# Patient Record
Sex: Male | Born: 1970 | Race: White | Hispanic: No | Marital: Married | Smoking: Never smoker
Health system: Southern US, Community
[De-identification: ages and names within clinical notes are randomized; demographics above are authoritative.]

---

## 2009-12-18 ENCOUNTER — Inpatient Hospital Stay: Payer: Self-pay | Admitting: Surgery

## 2009-12-22 LAB — PATHOLOGY REPORT

## 2009-12-31 ENCOUNTER — Ambulatory Visit: Payer: Self-pay | Admitting: Cardiovascular Disease

## 2010-04-03 HISTORY — PX: CHOLECYSTECTOMY: SHX55

## 2011-03-25 IMAGING — US ABDOMEN ULTRASOUND
1 series · 17 of 25 positions shown · non-contrast
Comparison: none

REASON FOR EXAM: abd pain check gb and aorta
COMMENTS:

PROCEDURE:     US  - US ABDOMEN GENERAL SURVEY  - December 18, 2009  [DATE]
RESULT:     Comparison: None
TECHNIQUE: Multiple gray-scale and color-flow Doppler images of the abdomen
are presented for review.

[Series 1: abdomen ultrasound · 17 of 82 slices shown]
[im 1/82]
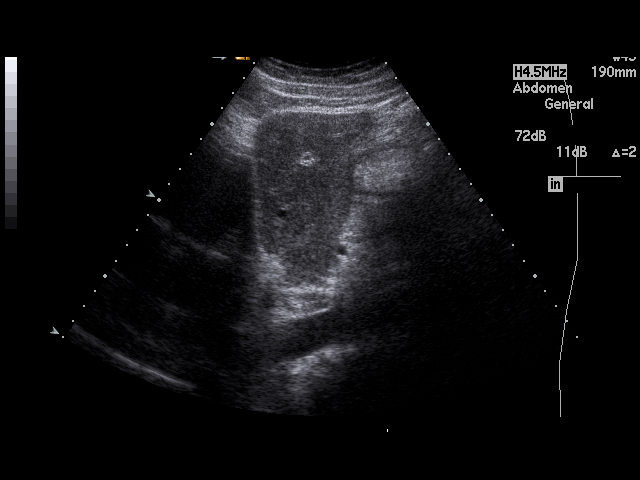
[im 7/82]
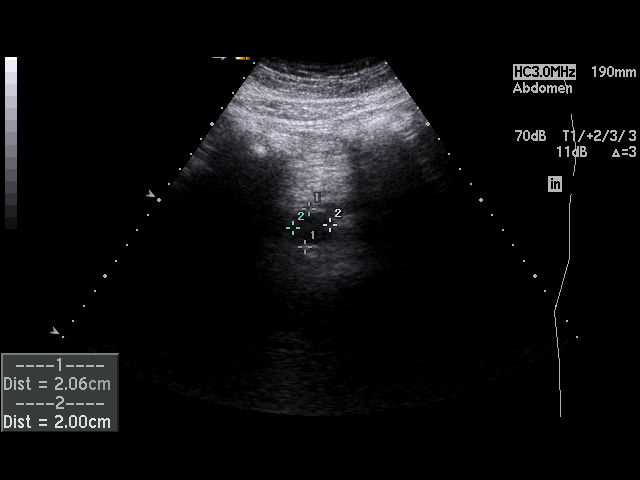
[im 11/82]
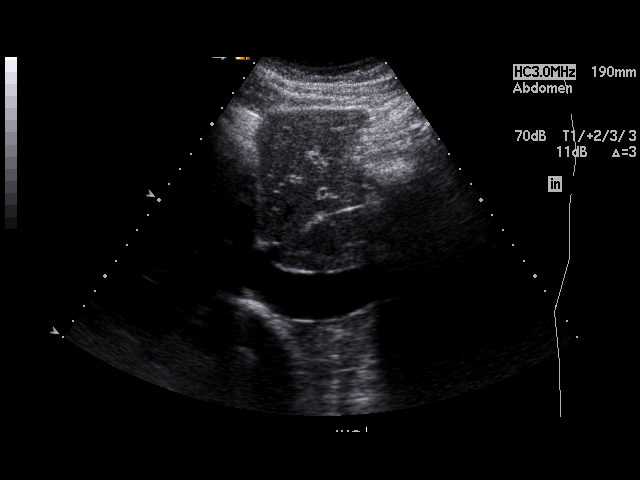
[im 17/82]
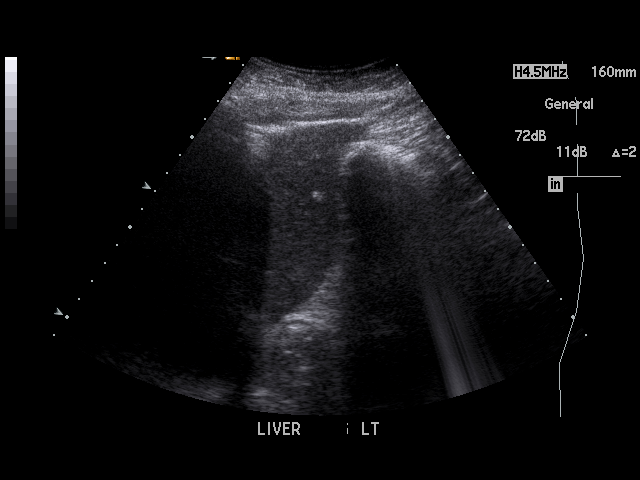
[im 21/82]
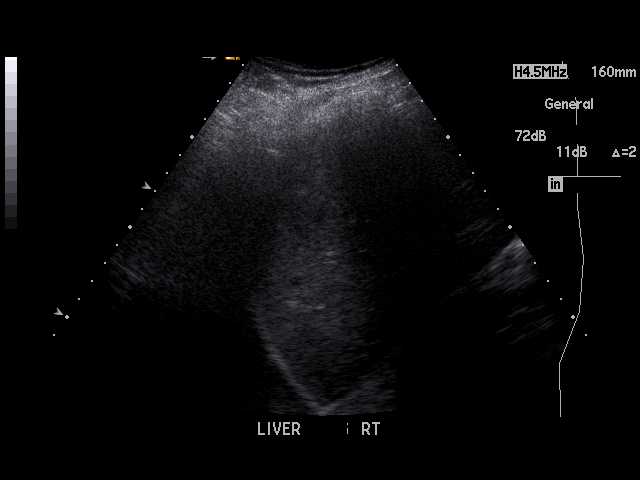
[im 28/82]
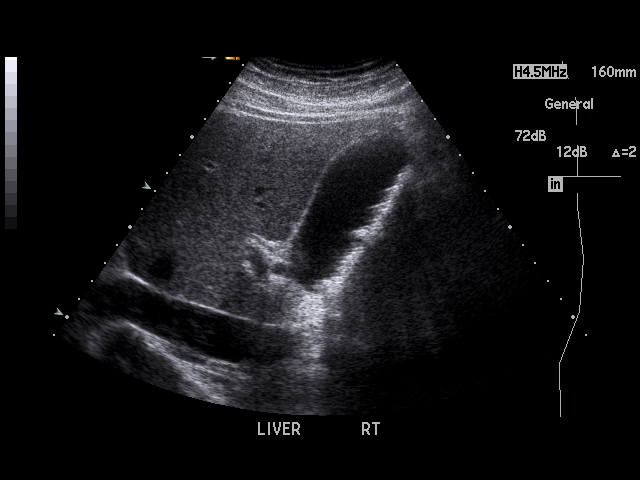
[im 31/82]
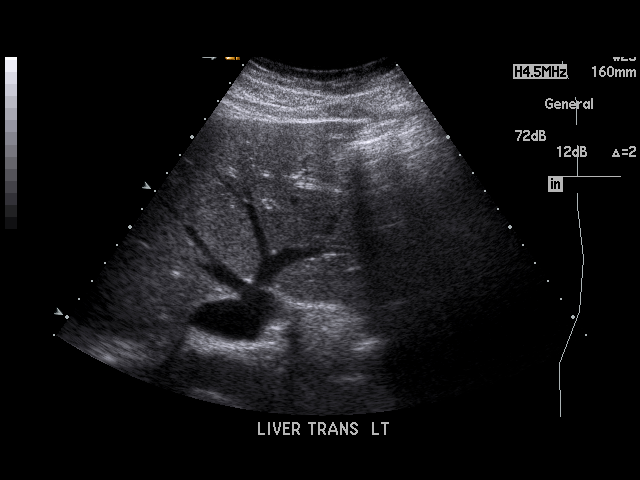
[im 38/82]
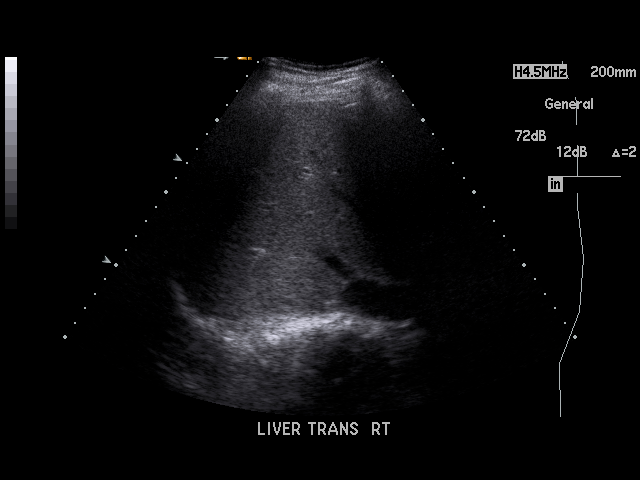
[im 41/82]
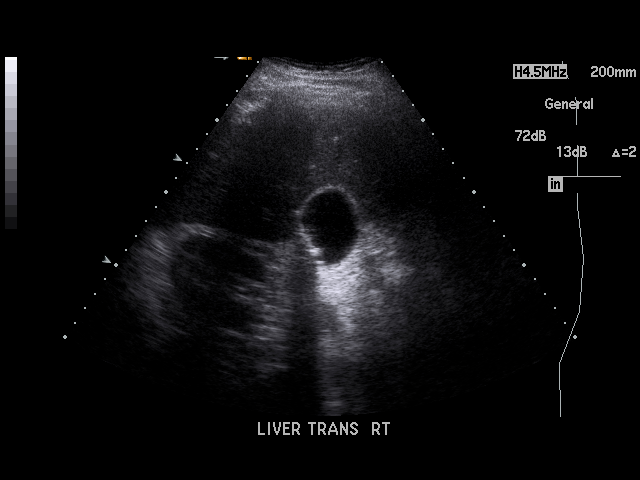
[im 44/82]
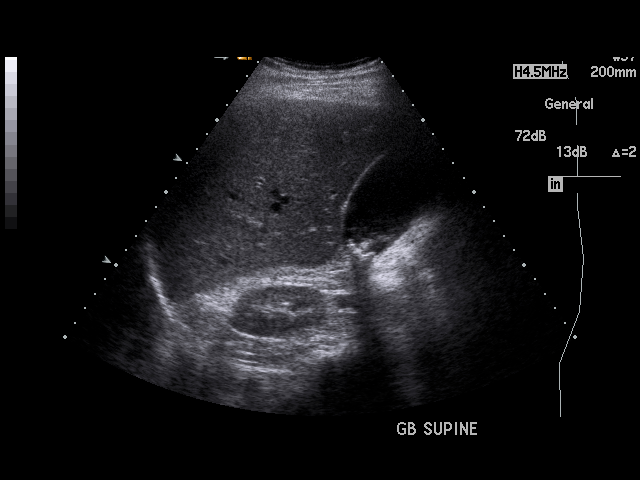
[im 51/82]
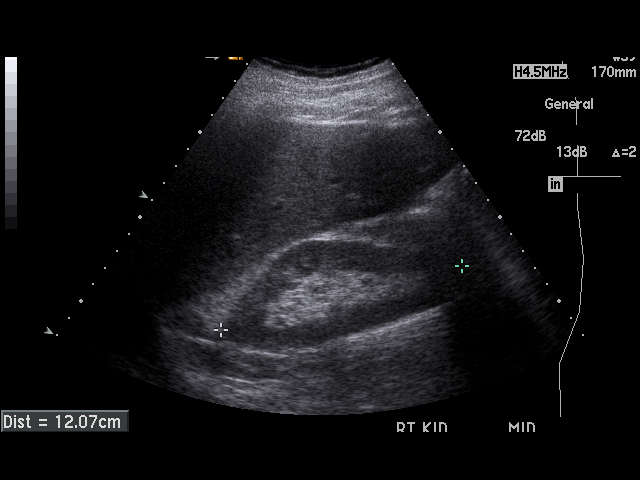
[im 55/82]
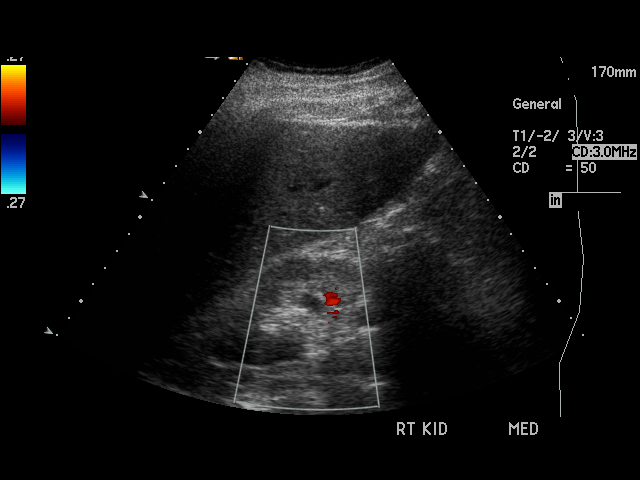
[im 61/82]
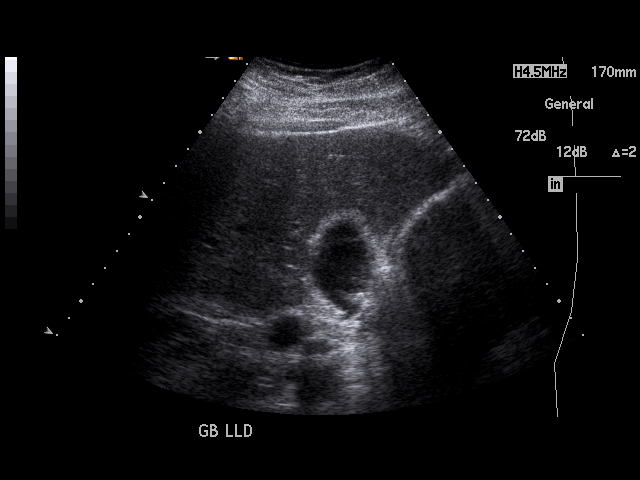
[im 65/82]
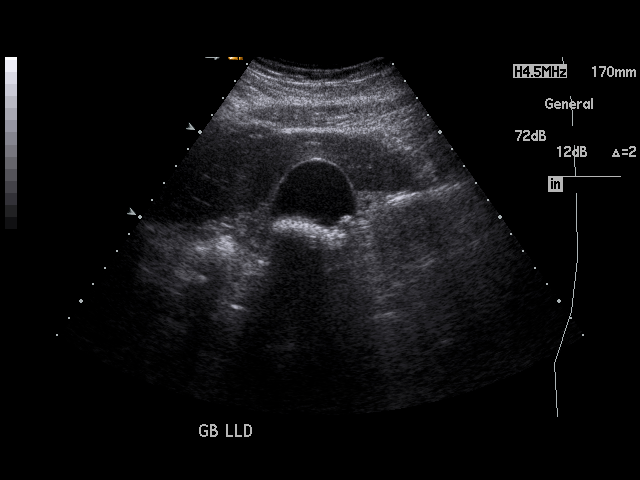
[im 71/82]
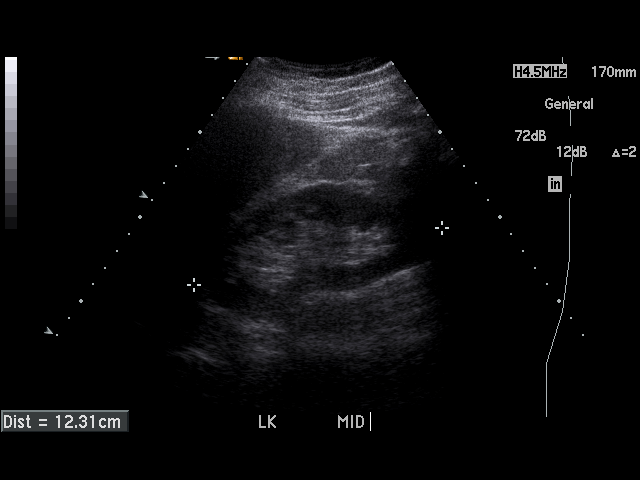
[im 75/82]
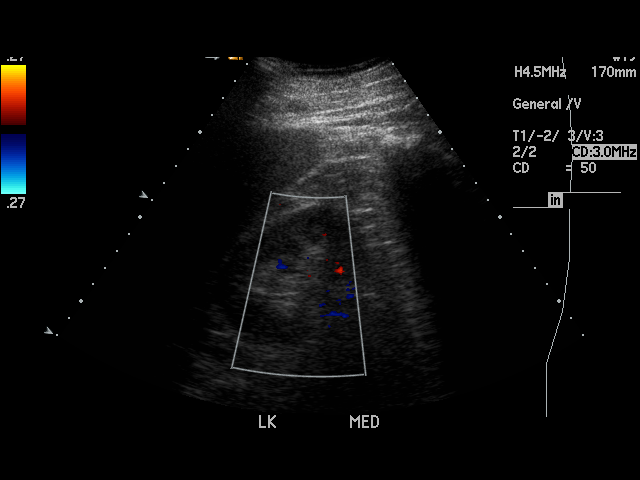
[im 82/82]
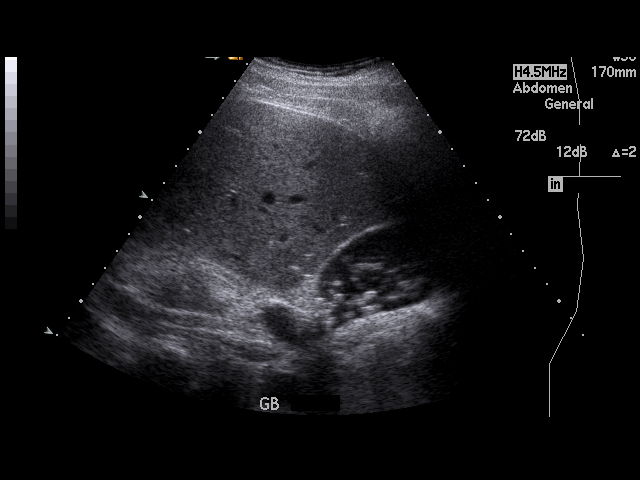

[17 of 25 positions shown; findings below may reference images not displayed]

FINDINGS: Visualized portions of the liver demonstrate normal echogenicity and normal
contours. The liver is without evidence of a focal hepatic lesion.

There are cholelithiasis. There is no intra- or extrahepatic biliary ductal
dilatation. The common duct measures 2.7 mm in maximal diameter. There is no
gallbladder wall thickening, pericholecystic fluid, or sonographic Murphy's
sign.

The visualized portion of the pancreas is normal in echogenicity. The spleen
is unremarkable. Bilateral kidneys are normal in echogenicity and size. The
right kidney measures 12.1 x 4.6 x 4.7 cm. The left kidney measures 12.3 x
5.2 x 5.4 cm. There are no renal calculi or hydronephrosis. The abdominal
aorta and IVC are unremarkable.
IMPRESSION: Cholelithiasis without sonographic evidence of acute cholecystitis.

## 2011-03-25 IMAGING — CR DG CHEST 1V PORT
1 series · 1 of 1 positions shown · non-contrast
Comparison: none

REASON FOR EXAM: sob weak
COMMENTS:

PROCEDURE:     DXR - DXR PORTABLE CHEST SINGLE VIEW  - December 18, 2009  [DATE]
RESULT:     Comparison: None

[view not recorded]
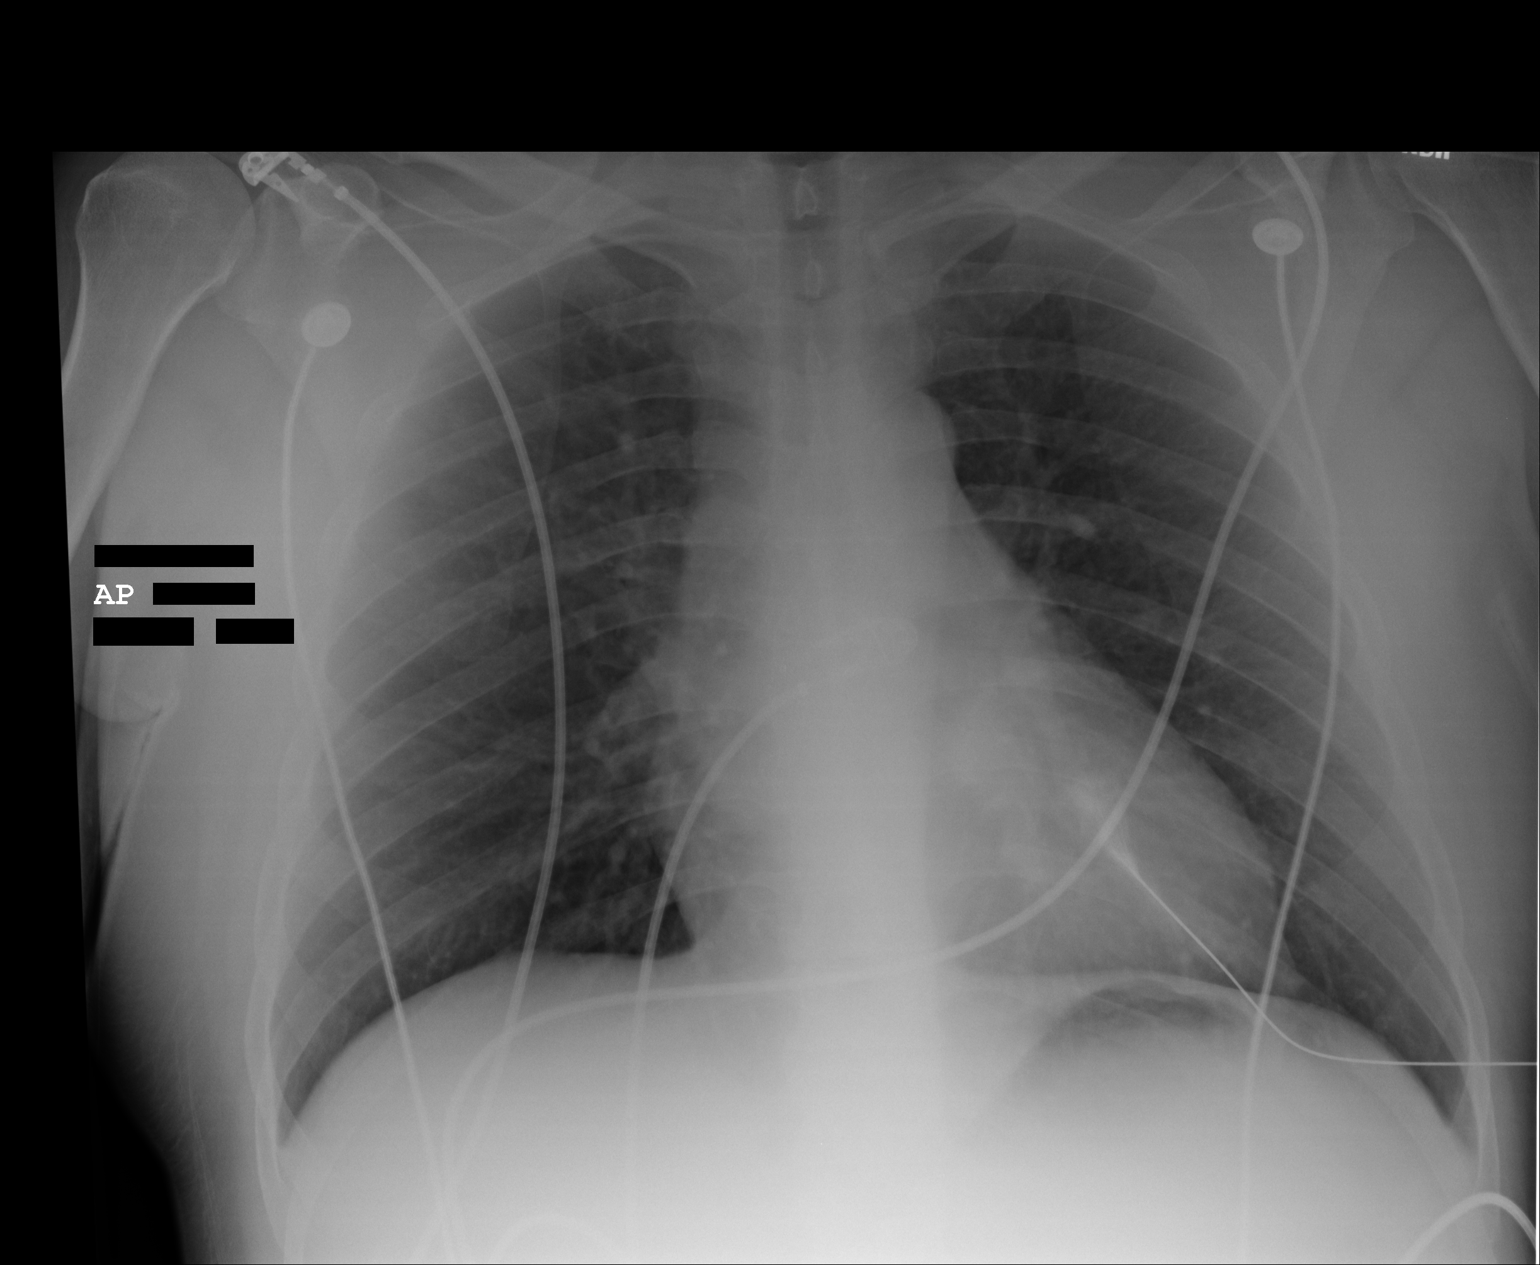

[1 of 1 positions shown; findings below may reference images not displayed]

FINDINGS: Single portable AP chest radiograph is provided. There is left lower lobe
retrocardiac hazy airspace disease which may represent atelectasis versus
infiltrate. There is no pleural effusion or pneumothorax. Normal
cardiomediastinal silhouette. The osseous structures are unremarkable.
IMPRESSION: There is left lower lobe retrocardiac hazy airspace disease which may
represent atelectasis versus infiltrate. Recommend followup radiography to
document complete resolution following adequate medical therapy. If there is
not complete resolution, then recommend further evaluation with CT of the
chest to exclude underlying pathology.

## 2014-04-03 HISTORY — PX: KNEE SURGERY: SHX244

## 2018-11-22 ENCOUNTER — Ambulatory Visit
Admission: EM | Admit: 2018-11-22 | Discharge: 2018-11-22 | Disposition: A | Discharge status: Home / Self Care | Payer: BC Managed Care – PPO | Attending: Urgent Care | Admitting: Urgent Care

## 2018-11-22 ENCOUNTER — Other Ambulatory Visit: Payer: Self-pay

## 2018-11-22 ENCOUNTER — Ambulatory Visit (INDEPENDENT_AMBULATORY_CARE_PROVIDER_SITE_OTHER): Payer: BC Managed Care – PPO

## 2018-11-22 DIAGNOSIS — R0782 Intercostal pain: Secondary | ICD-10-CM

## 2018-11-22 DIAGNOSIS — Z7189 Other specified counseling: Secondary | ICD-10-CM

## 2018-11-22 DIAGNOSIS — M7918 Myalgia, other site: Secondary | ICD-10-CM

## 2018-11-22 DIAGNOSIS — R0781 Pleurodynia: Secondary | ICD-10-CM | POA: Diagnosis not present

## 2018-11-22 NOTE — Discharge Instructions (Signed)
It was very nice seeing you today in clinic. Thank you for entrusting me with your care.   As discussed, your pain seems to be musculoskeletal in nature. Plans for treating you are as follows:  Please utilize the medications that we discussed. Your prescriptions have been called in to your pharmacy.  May use Tylenol and/or Ibuprofen as needed for pain. Avoid overdoing it, but you need to make efforts to remain active as tolerated.  Avoiding activity all together can make your pain worse. You may find that heat application will help with your pain.  Heat should be applied for 10-15 minutes at a time at least 3-4 times a day.  Make arrangements to follow up with your regular doctor in 1 week for re-evaluation. If your symptoms/condition worsens, please seek follow up care either here or in the ER. Please remember, our [place] providers are "right here with you" when you need [place].   Again, it was my pleasure to take care of you today. Thank you for choosing our clinic. I hope that you start to feel better quickly.   Honor Loh, MSN, APRN, FNP-C, CEN Advanced Practice Provider [facility]

## 2018-11-22 NOTE — ED Provider Notes (Signed)
[place], [place]   Name: Franklin Clark DOB: 1970/08/10 MRN: [id number] CSN: [id number] PCP: Nonda LouShapely-Quinn, Todd Meadowood, MD  Arrival date and time:  11/22/18 1400  Chief Complaint:  Rib Injury   NOTE: Prior to seeing the patient today, I have reviewed the triage nursing documentation and vital signs. Clinical staff has updated patient's PMH/PSHx, current medication list, and drug allergies/intolerances to ensure comprehensive history available to assist in medical decision making.   History:   HPI: Franklin Clark is a 48 y.o. male who presents today with complaints of atraumatic pain in his BILATERAL ribs that started with acute onset last night. He has had similar symptoms "from time to time" over the course of the last few weeks, however he notes that his symptoms last night were worse. Patient denies injury/trauma. He has not had any cough. Pain exacerbates with deep inspiration. Patient notes that pain abated last night when he took APAP and ASA. Patient denies any associated shortness of breath. He has not been around anyone known to be sick. Patient has no other complaints today in clinic.   History reviewed. No pertinent past medical history.  Past Surgical History:  Procedure Laterality Date  . CHOLECYSTECTOMY  2012  . KNEE SURGERY Left 2016    Family History  Problem Relation Age of Onset  . Heart disease Father     Social History   Tobacco Use  . Smoking status: Never Smoker  . Smokeless tobacco: Never Used  Substance Use Topics  . Alcohol use: Not Currently  . Drug use: Not Currently    There are no active problems to display for this patient.   Home Medications:    No outpatient medications have been marked as taking for the 11/22/18 encounter [facility](Hospital Encounter).    Allergies:   Patient has no known allergies.  Review of Systems (ROS): Review of Systems  Constitutional: Negative for chills and fever.  Respiratory: Negative for cough and shortness of  breath.   Cardiovascular: Negative for chest pain and palpitations.  Gastrointestinal: Negative for nausea and vomiting.  Musculoskeletal:       BILATERAL rib pain  Skin: Negative for color change, pallor and rash.  Neurological: Negative for dizziness, syncope, weakness and headaches.  All other systems reviewed and are negative.    Vital Signs: Today's Vitals   11/22/18 1421 11/22/18 1425 11/22/18 1511  BP:  (!) 150/78   Pulse:  (!) 57   Resp:  17   Temp:  98 F (36.7 C)   TempSrc:  Oral   SpO2:  99%   Weight: 250 lb (113.4 kg)    Height: 5\' 6"  (1.676 m)    PainSc: 1   1     Physical Exam: Physical Exam  Constitutional: He is oriented to person, place, and time and well-developed, well-nourished, and in no distress.  HENT:  Head: Normocephalic and atraumatic.  Mouth/Throat: Mucous membranes are normal.  Eyes: Pupils are equal, round, and reactive to light.  Neck: Normal range of motion. Neck supple.  Cardiovascular: Normal rate, regular rhythm, normal heart sounds and intact distal pulses. Exam reveals no gallop and no friction rub.  No murmur heard. Pulmonary/Chest: Effort normal and breath sounds normal. No respiratory distress. He has no wheezes. He has no rales. He exhibits no tenderness, no crepitus and no deformity.  Lymphadenopathy:    He has no cervical adenopathy.  Neurological: He is alert and oriented to person, place, and time. Gait normal. GCS score is  15.  Skin: Skin is warm and dry. No rash noted.  Psychiatric: Mood, memory, affect and judgment normal.  Nursing note and vitals reviewed.   Urgent Care Treatments / Results:   LABS: PLEASE NOTE: all labs that were ordered this encounter are listed, however only abnormal results are displayed. Labs Reviewed  NOVEL CORONAVIRUS, NAA (HOSPITAL ORDER, SEND-OUT TO REF LAB)    EKG: -None  RADIOLOGY: Dg Chest 2 View  Result Date: 11/22/2018 CLINICAL DATA:  Bilateral rib pain EXAM: CHEST - 2 VIEW  COMPARISON:  12/18/2009 FINDINGS: The heart size and mediastinal contours are within normal limits. Both lungs are clear. The visualized skeletal structures are unremarkable. IMPRESSION: No acute abnormality of the lungs. No obvious displaced rib fractures or other radiographic findings to explain pain. Electronically Signed   By: Lauralyn PrimesAlex  Bibbey M.D.   On: 11/22/2018 14:49    PROCEDURES: Procedures  MEDICATIONS RECEIVED THIS VISIT: Medications - No data to display  PERTINENT CLINICAL COURSE NOTES/UPDATES:   Initial Impression / Assessment and Plan / Urgent Care Course:  Pertinent labs & imaging results that were available during my care of the patient were personally reviewed by me and considered in my medical decision making (see lab/imaging section of note for values and interpretations).  Franklin Clark is a 48 y.o. male who [facility] with complaints of atraumatic rib pain.  Patient overall well appearing and in no acute distress today in clinic. Presenting symptoms (see HPI) and exam as documented above. He presents with concerns related to SARS-CoV-2 (novel coronavirus). Discussed typical symptom constellation. Patient adamant that testing be performed as he notes that "you never know when you may have been exposed". Patient attributing rib pain to a possible symptom of SARS-CoV-2. Reassurance provided. Will proceed with testing per patient request. Patient collected SARS-CoV-2 swab via facility approved self collection process today under the supervision of certified clinical staff. Discussed variable turn around times associated with testing, as sw[facility] are being [facility], and have been taking as long as 7 days. He was advised to self quarantine, per [facility] DH[facility] guidelines, until negative results received.   Radiographs of the chest performed today in clinic revealed no acute cardiopulmonary process; no evidence of peribronchial thickening, areas of  consolidation, or focal infiltrates. Films did not provide any evidence to account for patient's rib pain. Given that pain responsive to ASA and APAP, the etiology of his pain is felt to be musculoskeletal in nature. Again, reassurance provided. Recommended NSAIDs as needed. Advised that heat application by also help with his pain.   Discussed follow up with primary care physician in 1 week for re-evaluation. I have reviewed the follow up and strict return precautions for any new or worsening symptoms. Patient is aware of symptoms that would be deemed urgent/emergent, and would thus require further evaluation either here or in the emergency department. At the time of discharge, he verbalized understanding and consent with the discharge plan as it was reviewed with him. All questions were fielded by provider and/or clinic staff prior to patient discharge.    Final Clinical Impressions / Urgent Care Diagnoses:   Final diagnoses:  Rib pain  Advice Given About Covid-19 Virus Infection  Musculoskeletal pain    New Prescriptions:  [place] Controlled Substance Registry consulted? Not Applicable  No orders of the defined types were placed in this encounter.   Recommended Follow up Care:  Patient encouraged to follow up with the following provider within the  specified time frame, or sooner as dictated by the severity of his symptoms. As always, he was instructed that for any urgent/emergent care needs, he should seek care either here or in the emergency department for more immediate evaluation.  Follow-up Information    Shapely-Quinn, Okey Regal, MD In 1 week.   Specialty: Family Medicine Why: General reassessment of symptoms if not improving Contact information: [address] [place] [address] [phone]         NOTE: This note was prepared using Dragon dictation software along with smaller phrase technology. Despite my best ability to proofread, there is the  potential that transcriptional errors may still occur from this process, and are completely unintentional.    Karen Kitchens, NP 11/22/18 2041

## 2018-11-22 NOTE — ED Triage Notes (Signed)
Patient complains of bilateral rib pain that started last night and worsened throughout the night. Patient states that he has taken tylenol and aspirin. Patient states that he has had pain in the same area for over the last few weeks. Patient states that he is concerned that this could be a symptom of covid.

## 2018-11-23 LAB — NOVEL CORONAVIRUS, NAA (HOSP ORDER, SEND-OUT TO REF LAB; TAT 18-24 HRS): SARS-CoV-2, NAA: NOT DETECTED

## 2018-11-25 ENCOUNTER — Encounter (HOSPITAL_COMMUNITY): Payer: Self-pay

## 2020-02-27 IMAGING — CR CHEST - 2 VIEW
2 series · 2 of 2 positions shown · non-contrast
Comparison: 12/18/2009

CLINICAL DATA: Bilateral rib pain

EXAM:
CHEST - 2 VIEW

[chest pa]
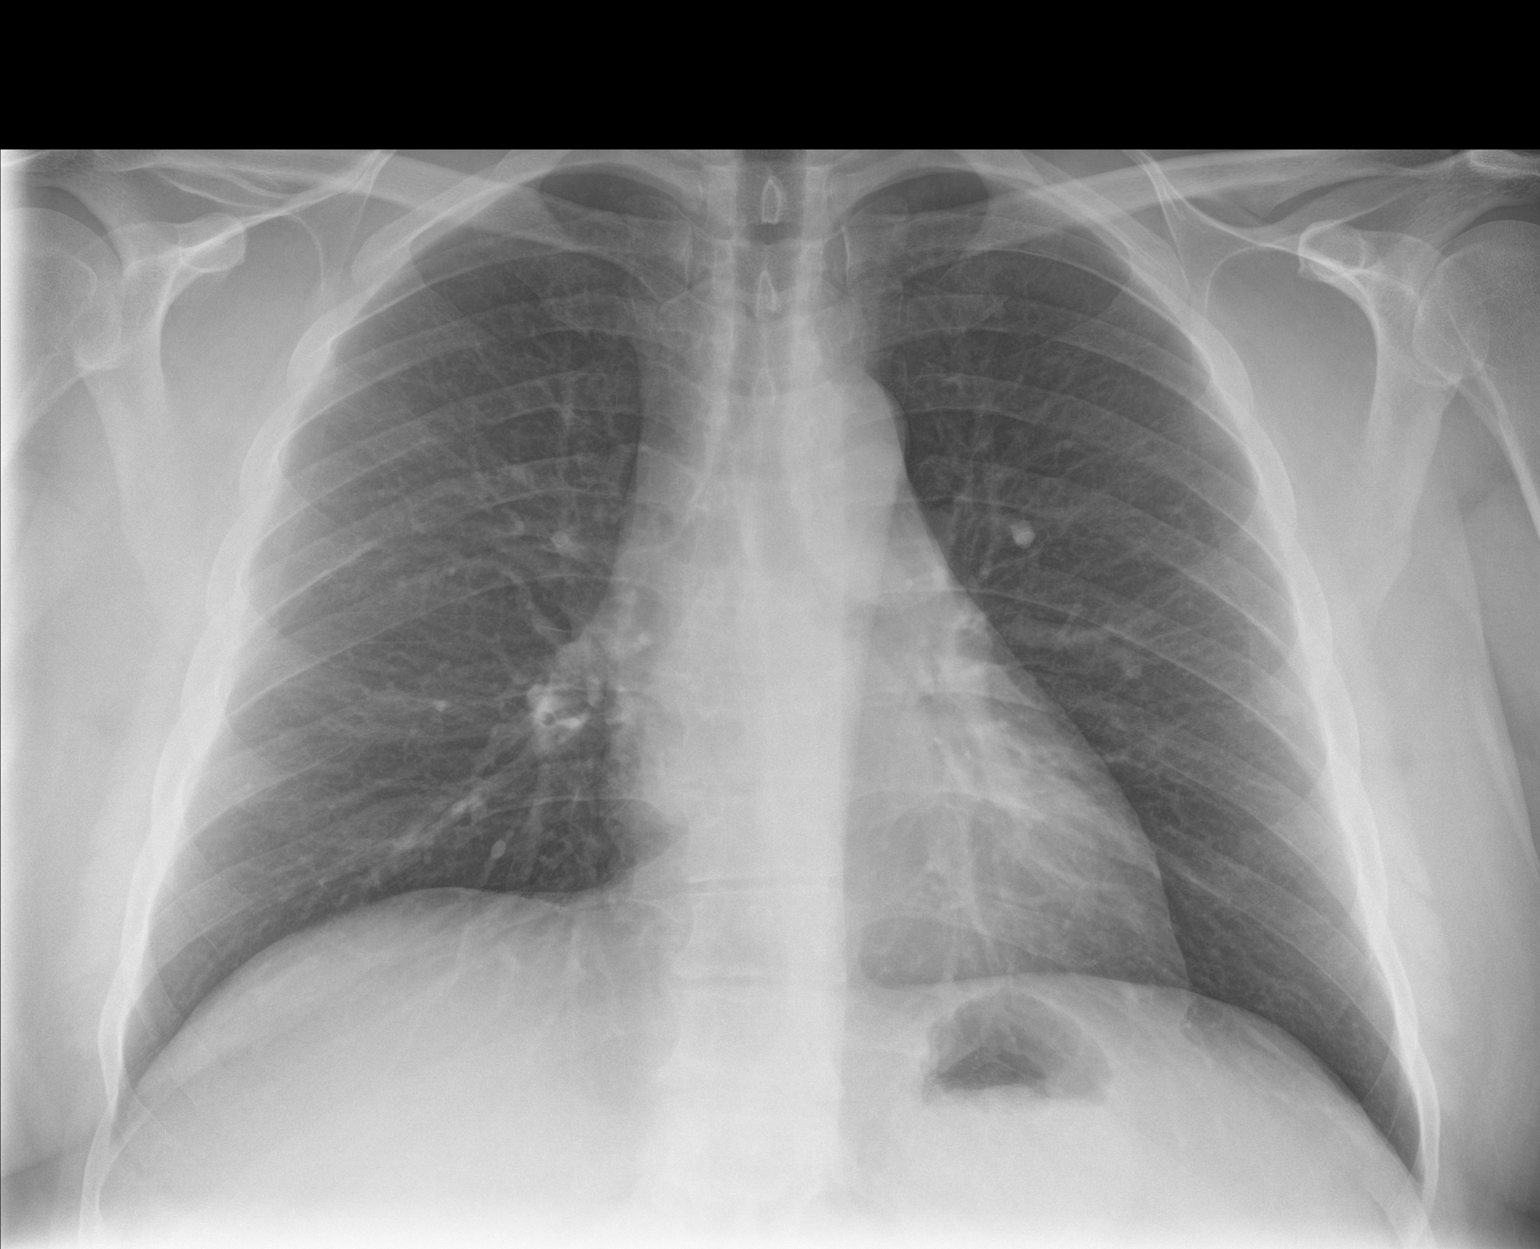

[chest lat]
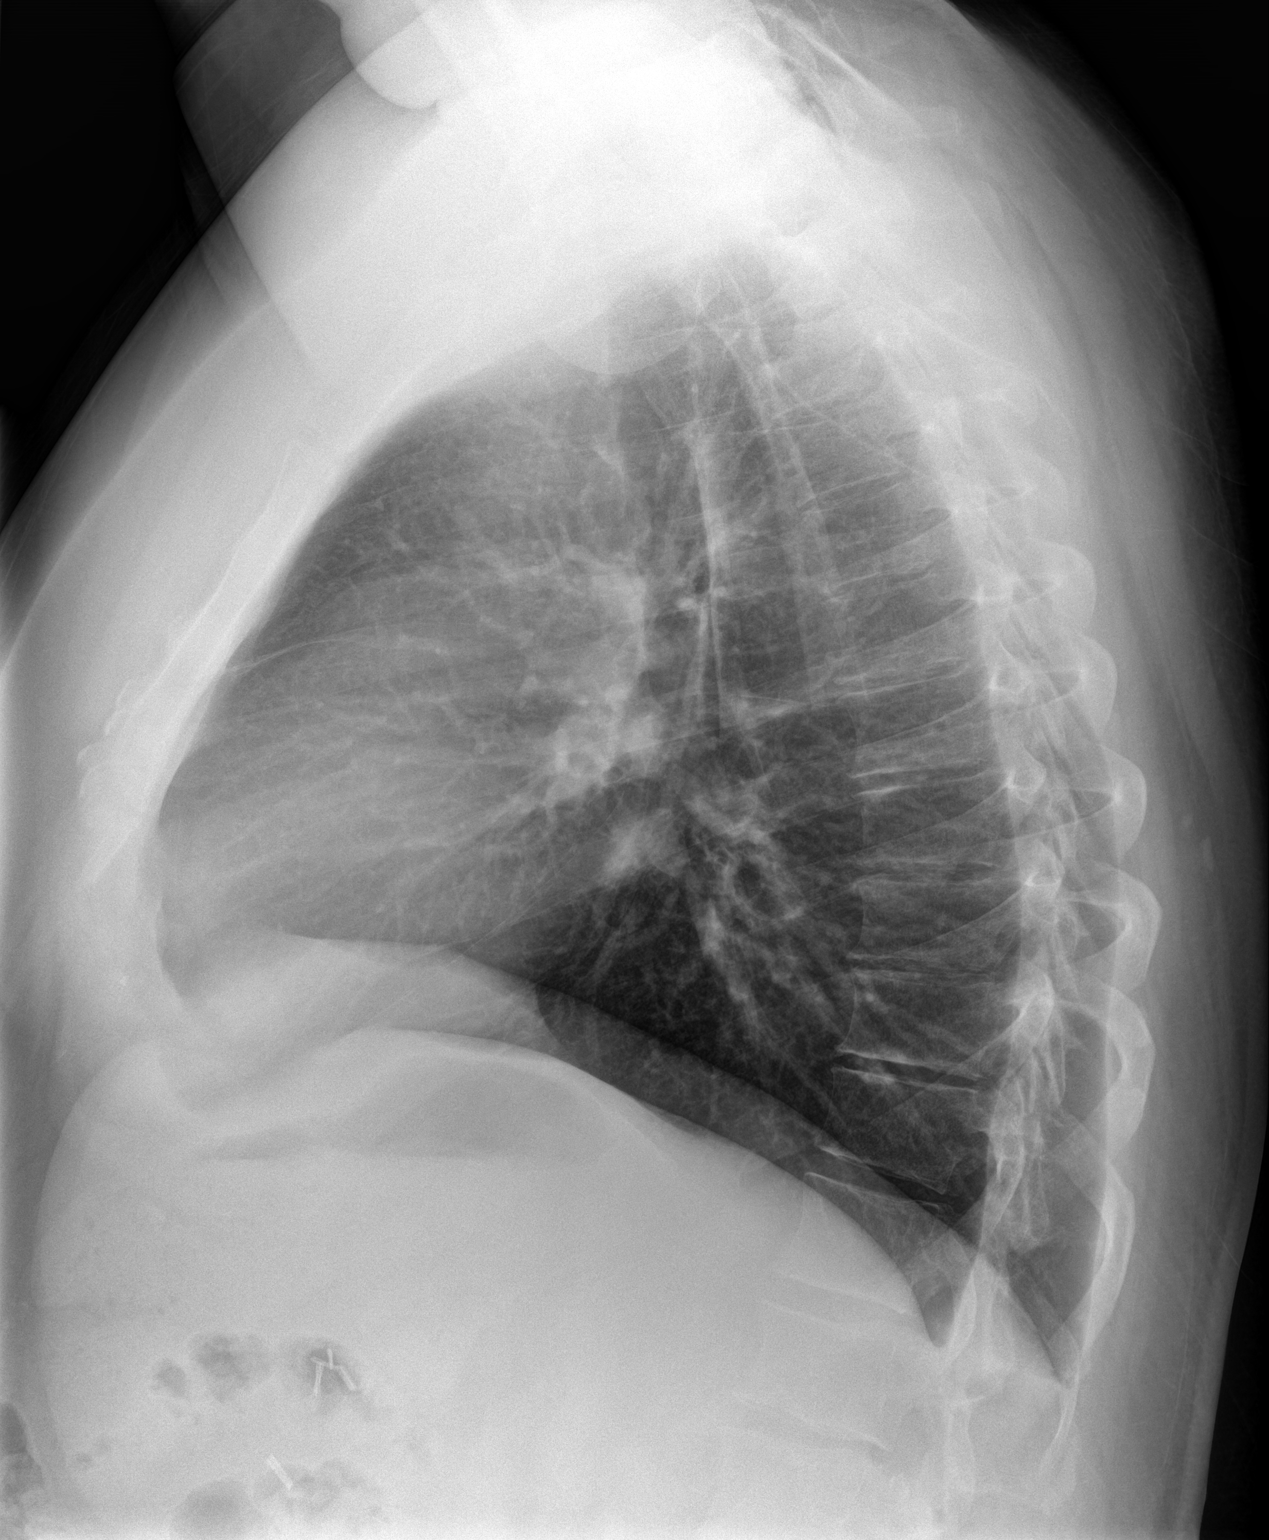

[2 of 2 positions shown; findings below may reference images not displayed]

FINDINGS: The heart size and mediastinal contours are within normal limits.
Both lungs are clear. The visualized skeletal structures are
unremarkable.
IMPRESSION: No acute abnormality of the lungs. No obvious displaced rib
fractures or other radiographic findings to explain pain.
